# Patient Record
Sex: Male | Born: 1995 | Race: White | Hispanic: No | Marital: Single | Smoking: Never smoker
Health system: Southern US, Community
[De-identification: ages and names within clinical notes are randomized; demographics above are authoritative.]

## PROBLEM LIST (undated history)

## (undated) DIAGNOSIS — K200 Eosinophilic esophagitis: Secondary | ICD-10-CM

## (undated) HISTORY — DX: Eosinophilic esophagitis: K20.0

---
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
## 2015-12-12 DIAGNOSIS — B349 Viral infection, unspecified: Secondary | ICD-10-CM | POA: Diagnosis not present

## 2016-08-20 ENCOUNTER — Ambulatory Visit
Admission: RE | Admit: 2016-08-20 | Discharge: 2016-08-20 | Disposition: A | Discharge status: Home / Self Care | Payer: 59 | Source: Ambulatory Visit | Attending: Family Medicine | Admitting: Family Medicine

## 2016-08-20 ENCOUNTER — Ambulatory Visit (INDEPENDENT_AMBULATORY_CARE_PROVIDER_SITE_OTHER): Payer: 59 | Admitting: Family Medicine

## 2016-08-20 ENCOUNTER — Encounter: Payer: Self-pay | Admitting: Family Medicine

## 2016-08-20 DIAGNOSIS — X58XXXA Exposure to other specified factors, initial encounter: Secondary | ICD-10-CM | POA: Diagnosis not present

## 2016-08-20 DIAGNOSIS — S0993XA Unspecified injury of face, initial encounter: Secondary | ICD-10-CM | POA: Diagnosis present
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
## 2016-08-27 ENCOUNTER — Encounter: Payer: Self-pay | Admitting: Family Medicine

## 2016-08-27 ENCOUNTER — Ambulatory Visit (INDEPENDENT_AMBULATORY_CARE_PROVIDER_SITE_OTHER): Payer: 59 | Admitting: Family Medicine

## 2016-08-27 DIAGNOSIS — S76319A Strain of muscle, fascia and tendon of the posterior muscle group at thigh level, unspecified thigh, initial encounter: Secondary | ICD-10-CM

## 2016-08-27 MED ORDER — NAPROXEN 500 MG PO TABS: 500.0000 mg | ORAL_TABLET | Freq: Two times a day (BID) | ORAL | 0 refills | Status: DC

## 2016-08-27 NOTE — Progress Notes (Signed)
Patient presents today for injury to his left hamstring. Patient states that last week he had a hamstring injury during soccer match. He states that he fell to the ground after the injury. He was unable to weight-bear for a few days. He is now partial weightbearing. He states that he has improved since the injury last week. He denies any significant swelling or bruising of the area. He denies any previous hamstring injury in the past. He denies any pain at the gluteal region or with sitting. He has been using ice on the area and has been doing some light stretching with the trainer.  ROS: Negative except mentioned above. Vitals as per Epic.  GENERAL: NAD RESP: CTA B CARD: RRR MSK: Left lower extremity - there is no obvious deformity noted in the hamstring area, there is no tenderness at the ischial tuberosity, mild tenderness along the proximal medial hamstring, no ecchymosis or significant swelling appreciated around this area, there is no distal hamstring discomfort, full range of motion of left lower extremity but discomfort with full flexion and extension, mildly decreased strength when testing hamstring compared to right, NV intact  A/P: Left proximal hamstring strain - exam does not suggest avulsion from ischial tuberosity, recommend trainer continue to do rehabilitation, Naprosyn when necessary, progress as tolerated, follow-up if symptoms persist or worsen.

## 2017-06-05 ENCOUNTER — Encounter: Payer: Self-pay | Admitting: Family Medicine

## 2017-06-05 ENCOUNTER — Ambulatory Visit (INDEPENDENT_AMBULATORY_CARE_PROVIDER_SITE_OTHER): Payer: 59 | Admitting: Family Medicine

## 2017-06-05 DIAGNOSIS — M79671 Pain in right foot: Secondary | ICD-10-CM

## 2017-06-05 MED ORDER — NAPROXEN 500 MG PO TABS: 500.0000 mg | ORAL_TABLET | Freq: Two times a day (BID) | ORAL | 0 refills | Status: DC

## 2017-06-05 NOTE — Progress Notes (Signed)
Patient presents today with symptoms of right lateral foot pain. Patient states that he is experiences pain for the last 4 days. He denies any obvious trauma or injury. He denies any swelling or bruising of the area. He states that he has been doing increased activity over the last couple of weeks but nothing out of the ordinary for him. He does not have pain at rest. He denies any previous history of stress injury stress fracture. He is typically having pain on palpation and walking and eversion. He has not gotten his new cleats for soccer yet but should be here soon. He does not wear orthotics.  ROS: Negative except mentioned above. Vitals as per Epic. GENERAL: NAD MSK: R Foot - no obvious deformity, no ecchymosis or swelling appreciated, mild tenderness along the lateral and plantar aspect of the base of the fifth metatarsal, full range of motion, mild discomfort with resisted eversion, no tenderness or swelling around the ankle, normal toe and heel walk, NV intact NEURO: CN II-XII grossly intact   A/P: Right lateral foot pain - likely related to peroneal tendon, will do x-rays, will take NSAIDs for a week or so and wear be in a walking boot. Can use ice on the area. Patient is to be evaluated by trainer on a daily basis for interval change. Trainer will look at current cleats and modify with new ones as soon as possible if needed. He will modify activity for now and progress as tolerated after pain has dissipated.

## 2017-06-06 ENCOUNTER — Ambulatory Visit
Admission: RE | Admit: 2017-06-06 | Discharge: 2017-06-06 | Disposition: A | Discharge status: Home / Self Care | Payer: 59 | Source: Ambulatory Visit | Attending: Family Medicine | Admitting: Family Medicine

## 2017-06-06 DIAGNOSIS — M79671 Pain in right foot: Secondary | ICD-10-CM | POA: Diagnosis not present
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
## 2017-12-16 ENCOUNTER — Encounter: Payer: Self-pay | Admitting: Family Medicine

## 2017-12-16 ENCOUNTER — Ambulatory Visit (INDEPENDENT_AMBULATORY_CARE_PROVIDER_SITE_OTHER): Payer: 59 | Admitting: Family Medicine

## 2017-12-16 VITALS — BP 128/70 | HR 96 | Temp 97.6°F | Resp 14

## 2017-12-16 DIAGNOSIS — M2669 Other specified disorders of temporomandibular joint: Secondary | ICD-10-CM

## 2017-12-16 DIAGNOSIS — R21 Rash and other nonspecific skin eruption: Secondary | ICD-10-CM

## 2017-12-16 MED ORDER — NAPROXEN 500 MG PO TABS: 500.0000 mg | ORAL_TABLET | Freq: Two times a day (BID) | ORAL | 0 refills | Status: AC
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
## 2018-05-31 ENCOUNTER — Emergency Department: Payer: No Typology Code available for payment source

## 2018-05-31 ENCOUNTER — Emergency Department
Admission: EM | Admit: 2018-05-31 | Discharge: 2018-05-31 | Disposition: A | Discharge status: Home / Self Care | Payer: No Typology Code available for payment source | Attending: Emergency Medicine | Admitting: Emergency Medicine

## 2018-05-31 DIAGNOSIS — S99911A Unspecified injury of right ankle, initial encounter: Secondary | ICD-10-CM | POA: Insufficient documentation

## 2018-05-31 DIAGNOSIS — W03XXXA Other fall on same level due to collision with another person, initial encounter: Secondary | ICD-10-CM | POA: Insufficient documentation

## 2018-05-31 DIAGNOSIS — Y9366 Activity, soccer: Secondary | ICD-10-CM | POA: Insufficient documentation

## 2018-05-31 MED ORDER — IBUPROFEN 600 MG PO TABS
600.00 mg | ORAL_TABLET | Freq: Four times a day (QID) | ORAL | 0 refills | Status: AC | PRN
Start: 2018-05-31 — End: 2018-06-07

## 2018-05-31 MED ORDER — IBUPROFEN 600 MG PO TABS
600.00 mg | ORAL_TABLET | Freq: Once | ORAL | Status: AC
Start: 2018-05-31 — End: 2018-05-31
  Administered 2018-05-31: 21:00:00 600 mg via ORAL
  Filled 2018-05-31: qty 1

## 2018-05-31 NOTE — ED Triage Notes (Signed)
Patient was playing soccer and another player fell on patient's right ankle.  Incident occurred at 5:30pm.  No pain meds taken.

## 2018-05-31 NOTE — Discharge Instructions (Signed)
Ankle Injury    You were seen for an ankle injury.    An x-ray of your ankle shows that you do not have broken bones or anything more serious.     Some symptoms of ankle injury are:    Swelling.   Bruising.   Other skin changes.   Limited movement.    Trouble walking.     Since your ankle pain is due to an injury, the pain may take up to a few weeks to get better. This depends on how bad it is. The pain is the worst the first day or two after the injury.    Generally, treatment for ankle injury includes the use of pain medicine and a splint to reduce movement. Treatment also involves Resting, Icing, Compressing and Elevating (lifting up) the injured area. Remember this as "RICE."   REST: Limit the use of the injured body part.     ICE: By applying ice to the affected area, swelling and pain can be reduced. Place some ice cubes in a re-sealable (Ziploc) bag and add some water. Put a thin washcloth between the bag and the skin. Apply the ice bag to the area for at least 20 minutes. Do this at least 4 times per day. Using the ice for longer times and more often is okay. NEVER APPLY ICE DIRECTLY TO THE SKIN.    COMPRESS: Compression means to apply pressure around the injured area, such as with a splint, cast or an ace bandage. Compression decreases swelling and improves comfort. Compression should be tight enough to relieve swelling but not so tight as to decrease circulation. Increasing pain, numbness, tingling, or change in skin color, are all signs of decreased circulation.    ELEVATE: Elevate the injured part. For example, a leg can be elevated by placing the leg on a chair while sitting and propped up on pillows while lying down.    Use medicine like acetaminophen (Tylenol) or ibuprofen (Advil, Motrin) for pain. Follow the directions on the package. You can find this at most pharmacies.    Wear a shoe with a hard sole for a while to help with comfort. Your doctor may suggest the use of an ACE  bandage or ankle brace. You can buy a walking boot at a medical supply store as well if your doctor suggests it.    Your doctor found that no further work-up was needed today. You may follow up as an outpatient with your regular doctor. You may have been referred to an orthopedic surgeon (bone doctor), a sports medicine specialist, dermatologist (skin doctor) or a rheumatologist.    YOU SHOULD SEEK MEDICAL ATTENTION IMMEDIATELY, EITHER HERE OR AT THE NEAREST EMERGENCY DEPARTMENT, IF ANY OF THE FOLLOWING HAPPENS:     You feel a severe increase in pain in the affected area.   You get new numbness and tingling in or under the affected area.   Your ankle gets red or swollen.   Your symptoms haven't started to get better in 1-2 weeks.   Your ankle seems out of shape after the swelling gets better, especially if you didn't get x-rays during your visit.   You have a fever (temperature higher than 100.2F or 38C).    If you can't follow up with your doctor, or if at any time you feel you need to be rechecked or seen again, come back here or go to the nearest emergency department.        Crutch Use (Edu)  To Stand:   Put crutches together on the side of the weak or injured leg. Hold on to hand grips with the hand closest to the crutches.   Push up with the other hand against the arm of the chair. Stand up.   Adjust crutches under each arm. The top of each crutch should rest against the sides of the chest wall. Push down on the hand grips. Don't lean on top of the crutches. This puts pressure on nerves in the armpit. This can lead to weakness and nerve damage.    To Walk:   Put crutches 8-12 inches (20-30 cm) in front of you and around 6-8 inches (15-20 cm) to the side of the toes.   Keep the weak / injured leg off the floor as you push down on the hand grips. Step through the crutches with the other leg.   Move crutches forward. Repeat.    If you can put some weight on the injured leg (partial weight  bearing):   Put crutches 8-12 inches (20-30 cm) in front of you and around 6-8 inches (15-20 cm) to the side of the toes.   Put partial weight on the weak / injured leg. If allowed, put as much weight as you can bear.   Push down on the hand grips. Step through with the strong leg.   Move crutches forward. Repeat. Move the crutches and weak / injured leg forward together.    DO NOT place weight on the cast or splint unless you were told to.     Splint Care/Use (Edu)    You have been given a splint for your fracture. This is to lower pain and help keep the injured area from moving.    Use the splint until you follow up with the referral orthopedic (bone) doctor.    Use the following SPLINT CARE instructions. Do the following many times throughout the day:   Check capillary refill (circulation) in the nail beds. Press on the nail bed and then release. It should turn white when you press on it. It should then get pink again in less than 2 seconds after you let go.   Watch to see if the area beyond the splint gets swollen.   Sometimes the splint is too tight. When this happens, the skin of the hand/foot, fingers/toes is very cold, pale or numb to the touch. The wrap holding the splint in place can be loosened. You can come back here or go to the nearest Emergency Department to have it adjusted.

## 2018-05-31 NOTE — ED Provider Notes (Signed)
EMERGENCY DEPARTMENT HISTORY AND PHYSICAL EXAM     Physician/Midlevel provider first contact with patient: 05/31/18 2009         Date: 05/31/2018  Patient Name: Derek Reyes  Attending Physician: Kari Baars, MD  Advanced Practice Provider: Modesta Messing    History of Presenting Illness     History Provided By: Patient  Chief Complaint:  Chief Complaint   Patient presents with   . Ankle Injury     Derek Reyes is a 22 y.o. male presenting to the ED with right ankle injury during soccer when another player fell onto him at 530p. Unable to bear weight. No other injuries or complaints.    Onset,Timing, Location, Quality, Severity, Exacerbating factors, Alleviating factors  Associated symptoms and pertinent negative listed in ROS.  PCP: Pcp, None, MD  SPECIALISTSZayed, Griffie   Home Medication Instructions [id number]    Printed on:05/31/18 2125   Medication Information 08 AM 10 AM 12 Noon 06 PM 08 PM 10 PM Bed time             budesonide (PULMICORT) 0.25 MG/2ML nebulizer solution  Take 0.25 mg by nebulization            ibuprofen (ADVIL,MOTRIN) 600 MG tablet  Take 1 tablet (600 mg total) by mouth every 6 (six) hours as needed for Pain              Past History   Past Medical History:  Past Medical History:   Diagnosis Date   . Eosinophilic esophagitis      Past Surgical History:  Past Surgical History:   Procedure Laterality Date   . APPENDECTOMY       Family History:  Family History   Problem Relation Age of Onset   . Family history unknown: Yes     Social History:  Social History     Social History   . Marital status: Single     Spouse name: N/A   . Number of children: N/A   . Years of education: N/A     Social History Main Topics   . Smoking status: Never Smoker   . Smokeless tobacco: Never Used   . Alcohol use No   . Drug use: No   . Sexual activity: Not Asked     Other Topics Concern   . None     Social History Narrative   . None     Allergies:  No Known Allergies  Review of  Systems   Review of Systems   Constitutional: Negative for fever.   Musculoskeletal: Positive for joint pain.     Physical Exam     Vitals:    05/31/18 2002   BP: 126/73   Pulse: 80   Resp: 20   Temp: 98.1 F (36.7 C)   TempSrc: Oral   SpO2: 96%   Weight: 90.7 kg   Height: 6\' 2"  (1.88 m)     Physical Exam   Constitutional: He is oriented to person, place, and time. He appears well-developed and well-nourished.   Family member at bedside with patient.    HENT:   Head: Normocephalic and atraumatic.   Eyes: Conjunctivae are normal. Right eye exhibits no discharge. Left eye exhibits no discharge. No scleral icterus.   Neck: Neck supple.   Cardiovascular: Intact distal pulses.    Pulmonary/Chest: Effort normal. No stridor. No respiratory distress.   Musculoskeletal:        Right  ankle: He exhibits decreased range of motion and swelling. He exhibits normal pulse. Tenderness. Lateral malleolus and medial malleolus tenderness found. No head of 5th metatarsal and no proximal fibula tenderness found. Achilles tendon exhibits no pain.        Right foot: There is decreased range of motion, tenderness and bony tenderness.        Feet:    Neurological: He is alert and oriented to person, place, and time.   Skin: Skin is warm.   Examined where exposed   Psychiatric: He has a normal mood and affect.   Nursing note and vitals reviewed.    Diagnostic Study Results   Labs -     Results     ** No results found for the last 24 hours. **        Radiologic Studies -   Radiology Results (24 Hour)     Procedure Component Value Units Date/Time    XR Foot Right AP Lateral And Oblique [[id number]] Collected:  05/31/18 2055    Order Status:  Completed Updated:  05/31/18 2100    Narrative:       HISTORY: Acute right foot and ankle pain, tenderness, trauma    EXAM: Foot, 3 views    FINDINGS: AP, lateral and oblique view of the right foot were performed.  There is no acute fracture or dislocation noted.      Impression:        Negative for acute  fracture as above    Charlene Brooke, MD   05/31/2018 8:56 PM    XR Ankle Right 3+ Views [[id number]] Collected:  05/31/18 2054    Order Status:  Completed Updated:  05/31/18 2059    Narrative:       HISTORY: Acute right ankle pain, tenderness, trauma    EXAM: Right ankle, 4 views    FINDINGS: The mortise is intact. There is no acute fracture or  dislocation seen.    There is a 3 mm well-corticated bony density off the anterior talus and  posterior talus consistent with old trauma. Likewise, there is a 5 mm  well-corticated bony density off the dorsal and proximal navicular on  the lateral view.    There is probable 1 cm cyst in the mid calcaneus.      Impression:          1. The mortise is intact and no acute fracture seen. Probable old  fractures are identified as above.    Charlene Brooke, MD   05/31/2018 8:55 PM      .  Medical Decision Making   I am the first provider for this patient.    I reviewed the vital signs, available nursing notes, past medical history, past surgical history, family history and social history.    Vital Signs-Reviewed the patient's vital signs.     Patient Vitals for the past 12 hrs:   BP Temp Pulse Resp   05/31/18 2002 126/73 98.1 F (36.7 C) 80 20       Pulse Oximetry Analysis - NormalNormal SpO2: 96 % on RA    Procedures:   Procedures    ED Course:        Provider Notes:    22 y.o. male right ankle injury during soccer this evening. Xray neg for fx however findings related to old injury. Pt denies any prior fx. Pt is tender of the anterior talus where there is a bony density. Will place in posterior/stirrup ankle splint, crutches.  RICE. Nsaids. Ortho/Podiatry f/u in 1-5 days.  Pt advised of incidental xray findings of calcaneal cyst and other bony densities.     D/w Kari Baars, MD    Discharge Prescriptions     Medication Sig Dispense Auth. Provider    ibuprofen (ADVIL,MOTRIN) 600 MG tablet Take 1 tablet (600 mg total) by mouth every 6 (six) hours as needed for Pain 15  tablet Vanessa Kick [place], [place]            Diagnosis     Clinical Impression:   1. Right ankle injury, initial encounter        Treatment Plan:   ED Disposition     ED Disposition Condition Date/Time Comment    Discharge  Sun May 31, 2018  9:21 PM Derek Reyes discharge to home/self care.    Condition at disposition: Stable            _______________________________    CHART OWNERSHIPDorette Grate, PA-C, am the primary clinician of record.     Vanessa Kick [place], [place]  05/31/18 2125       Kari Baars, MD  06/01/18 1052

## 2018-06-05 ENCOUNTER — Ambulatory Visit (INDEPENDENT_AMBULATORY_CARE_PROVIDER_SITE_OTHER): Payer: No Typology Code available for payment source | Admitting: Orthopaedic Surgery

## 2018-06-05 ENCOUNTER — Encounter (INDEPENDENT_AMBULATORY_CARE_PROVIDER_SITE_OTHER): Payer: Self-pay

## 2018-06-05 ENCOUNTER — Encounter (INDEPENDENT_AMBULATORY_CARE_PROVIDER_SITE_OTHER): Payer: Self-pay | Admitting: Orthopaedic Surgery

## 2018-06-05 VITALS — BP 113/69 | HR 80 | Ht 74.0 in | Wt 200.0 lb

## 2018-06-05 DIAGNOSIS — S93491A Sprain of other ligament of right ankle, initial encounter: Secondary | ICD-10-CM

## 2018-06-05 DIAGNOSIS — S99921A Unspecified injury of right foot, initial encounter: Secondary | ICD-10-CM

## 2018-06-05 DIAGNOSIS — S93621A Sprain of tarsometatarsal ligament of right foot, initial encounter: Secondary | ICD-10-CM

## 2018-06-05 NOTE — Progress Notes (Signed)
At the request of Mr. Deman, I faxed the office notes and MRI referral to his athletic trainer, Ricard Dillon, at [phone]. Mr. Moradi is planning to have the MRI done at school in [place] or at home in [place]. EP

## 2018-06-05 NOTE — Progress Notes (Signed)
SUBJECTIVE:  Derek Reyes is a 22 y.o. male who sustained a right ankle/foot injury he sustained a few days  ago.    This happened when He was playing soccer.  He has foot planted and a player ran into full speed and he had an sharp injury and pain into his foot..      The patient had immediate pain. Symptoms have been constant since that time. [name] rates the pain 7.  It is described as sharp.  It is better with rest and worse with weight bearing     She did go to emergency department where x-rays negative for fracture.  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    Past Medical History:   Diagnosis Date   . Eosinophilic esophagitis      Past Surgical History:   Procedure Laterality Date   . APPENDECTOMY       Current Outpatient Prescriptions on File Prior to Visit   Medication Sig Dispense Refill   . budesonide (PULMICORT) 0.25 MG/2ML nebulizer solution Take 0.25 mg by nebulization     . ibuprofen (ADVIL,MOTRIN) 600 MG tablet Take 1 tablet (600 mg total) by mouth every 6 (six) hours as needed for Pain 15 tablet 0     No current facility-administered medications on file prior to visit.      Allergies   Allergen Reactions   . Banana    . Cherry    . Grape (Artificial) Flavor      Social History     Social History   . Marital status: Single     Spouse name: N/A   . Number of children: N/A   . Years of education: N/A     Occupational History   . Not on file.     Social History Main Topics   . Smoking status: Never Smoker   . Smokeless tobacco: Never Used   . Alcohol use No   . Drug use: No   . Sexual activity: Not on file     Other Topics Concern   . Not on file     Social History Narrative   . No narrative on file     Family History   Problem Relation Age of Onset   . Family history unknown: Yes       REVIEW OF SYSTEMS: History obtained from chart review and the patient.  General: negative for chills, fever or night sweats  Hematological and  Lymphatic: negative for bleeding problems, jaundice or pallor  Endocrine: negative for hot flashes, malaise/lethargy or mood swings  Respiratory: negative for cough, shortness of breath or wheezing  Cardiovascular: negative for chest pain, LOC or SOB  Gastrointestinal: negative for abdominal pain, appetite loss or nausea/vomiting  Musculoskeletal: positive for foot/ankle pain as listed in the history of present illness.  All other systems are negative.    Vitals:    06/05/18 0957   BP: 113/69   Pulse: 80   Weight: 90.7 kg (200 lb)   Height: 1.88 m (6\' 2" )        General: The patient is a well appearing male who appears the stated age and is in no acute distress  Psychiatry: Normal affect  Neuro: Cranial nerves 2-12 grossly intact  Eyes: No sceleral icterus  Neck: Supple, no lymphadenopathy  Cardiovascular: Normal rate and rhythem by pulse exam  Respiratory: Normal excursion of the chest wall  Abdomen:  Soft, non distended  Skin: Intact, no lesions noted    Left (Asymptomatic side) Ankle  No gross deformity  No tenderness to palpation over the metatarsal and phalanges  No pain with supination and pronation of the foot  5/5 strength with ankle dorsiflexion and plantarflexion and EHL  2+ DP pulse  Sensation intact to light touch    Right (Symptomatic side) Ankle/Foot  There is no gross deformity  There is no swelling over the lateral ankle  There is tenderness Over the midfoot.  There is pain with shucking of the 1st and 2nd metatarsal.  There is pain with supination and pronation of the foot  Anterior Drawer sign is 1+  He is unable to bear weight without sufficient pain.    5/5 strength with ankle dorsiflexion and plantarflexion and EHL  2+ DP pulse  Sensation intact to light touch    Imaging studies: Xrays of the right ankle/foot (AP, lateral and oblique views) obtained ER and personally reviewed by me show no obvious fracture.    OUTSIDE RECORDS  I also had a chance to review personally records from the patient's  emergency room department visit from our electronic medical record system.  These records show He is seen in emergent department on May 23, 2018.  X-rays negative for fracture.  He was told to follow-up with orthopedics      Impression Right foot injury, concern for Lisfranc injury    Plan:  I recommend he obtain an MRI of his right foot to rule out a Lisfranc injury.  The meantime, he will be nonweightbearing in a walking boot at this time.  I will see him back once he has the MRI done.

## 2018-06-10 ENCOUNTER — Encounter (INDEPENDENT_AMBULATORY_CARE_PROVIDER_SITE_OTHER): Payer: Self-pay

## 2018-06-10 NOTE — Progress Notes (Signed)
The mother of Derek Reyes called: she requested that we fax the MRI referral to [facility], fax [phone], in [place]. She is inquiring whether we have authorized the MRI and I explained no, the facility where the MRI is being performed should work on this authorization; however, they may call if they have questions. I have included the office notes and xr reports for the clinical info they may need. EP

## 2018-06-12 ENCOUNTER — Encounter (INDEPENDENT_AMBULATORY_CARE_PROVIDER_SITE_OTHER): Payer: Self-pay

## 2018-06-12 NOTE — Progress Notes (Signed)
Ms. Pinsky called again to request that we send the MRI referral: she would like for this to go to [facility] in [place], faxed to [phone]. I again explained that the facility performing the MRI will need to work on the auth with [facility], and that the timeframe to have the MRI on Tues is very tight. I sent the recent office note, xr reports, and MRI referral to [facility]. EP

## 2018-06-15 ENCOUNTER — Other Ambulatory Visit: Payer: Self-pay | Admitting: Family Medicine

## 2018-06-15 DIAGNOSIS — S93621A Sprain of tarsometatarsal ligament of right foot, initial encounter: Secondary | ICD-10-CM

## 2018-06-15 DIAGNOSIS — S99921A Unspecified injury of right foot, initial encounter: Secondary | ICD-10-CM

## 2018-06-16 ENCOUNTER — Encounter (INDEPENDENT_AMBULATORY_CARE_PROVIDER_SITE_OTHER): Payer: Self-pay

## 2018-06-16 ENCOUNTER — Ambulatory Visit
Admission: RE | Admit: 2018-06-16 | Discharge: 2018-06-16 | Disposition: A | Discharge status: Home / Self Care | Payer: 59 | Source: Ambulatory Visit | Attending: Family Medicine | Admitting: Family Medicine

## 2018-06-16 DIAGNOSIS — X58XXXA Exposure to other specified factors, initial encounter: Secondary | ICD-10-CM | POA: Insufficient documentation

## 2018-06-16 DIAGNOSIS — S99921A Unspecified injury of right foot, initial encounter: Secondary | ICD-10-CM | POA: Insufficient documentation

## 2018-06-16 DIAGNOSIS — S93621A Sprain of tarsometatarsal ligament of right foot, initial encounter: Secondary | ICD-10-CM

## 2018-06-16 NOTE — Progress Notes (Signed)
I received multiple calls from [facility], [facility], Sharron, and his Mother that they have the MRI coming up today: I called [employer] and spoke with Lurena Joiner to review the clinical info: PA  For [facility] [place]  [phone], 06/16/18- 07/31/18. I called back to [facility] and gave the info to the scheduling staff and asked that they relay the info to the staff there. EP

## 2018-07-01 ENCOUNTER — Encounter (INDEPENDENT_AMBULATORY_CARE_PROVIDER_SITE_OTHER): Payer: Self-pay

## 2018-07-01 ENCOUNTER — Other Ambulatory Visit (INDEPENDENT_AMBULATORY_CARE_PROVIDER_SITE_OTHER): Payer: Self-pay | Admitting: Orthopaedic Surgery

## 2018-07-01 DIAGNOSIS — S93621A Sprain of tarsometatarsal ligament of right foot, initial encounter: Secondary | ICD-10-CM

## 2019-04-28 IMAGING — CR DG FOOT COMPLETE 3+V*R*
1 series · 4 of 4 positions shown · non-contrast
Comparison: June 06, 2017

CLINICAL DATA: Soccer injury 2 weeks prior

EXAM:
RIGHT FOOT COMPLETE - 3+ VIEW

[Series 1: dg foot complete right · 0.14mm/px · 4 of 4 slices shown]
[im 1/4]
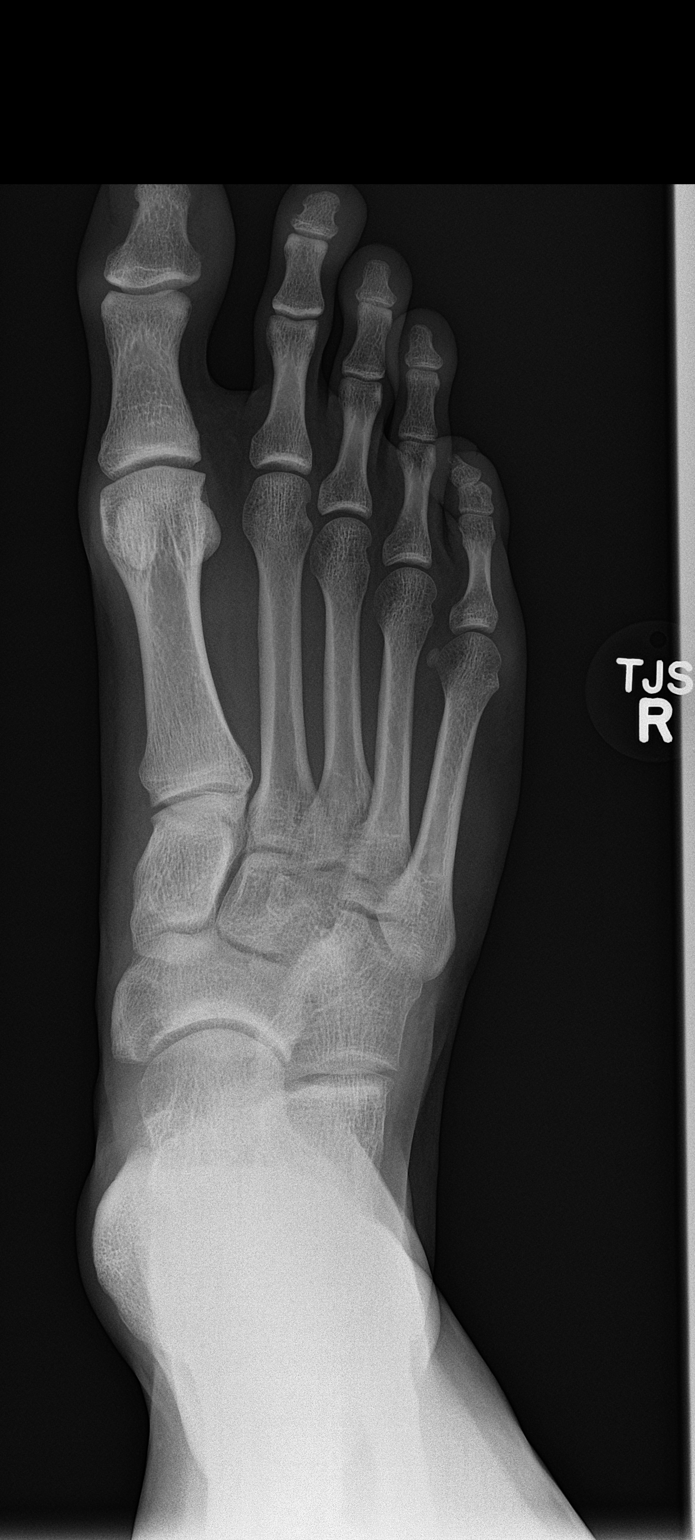
[im 2/4]
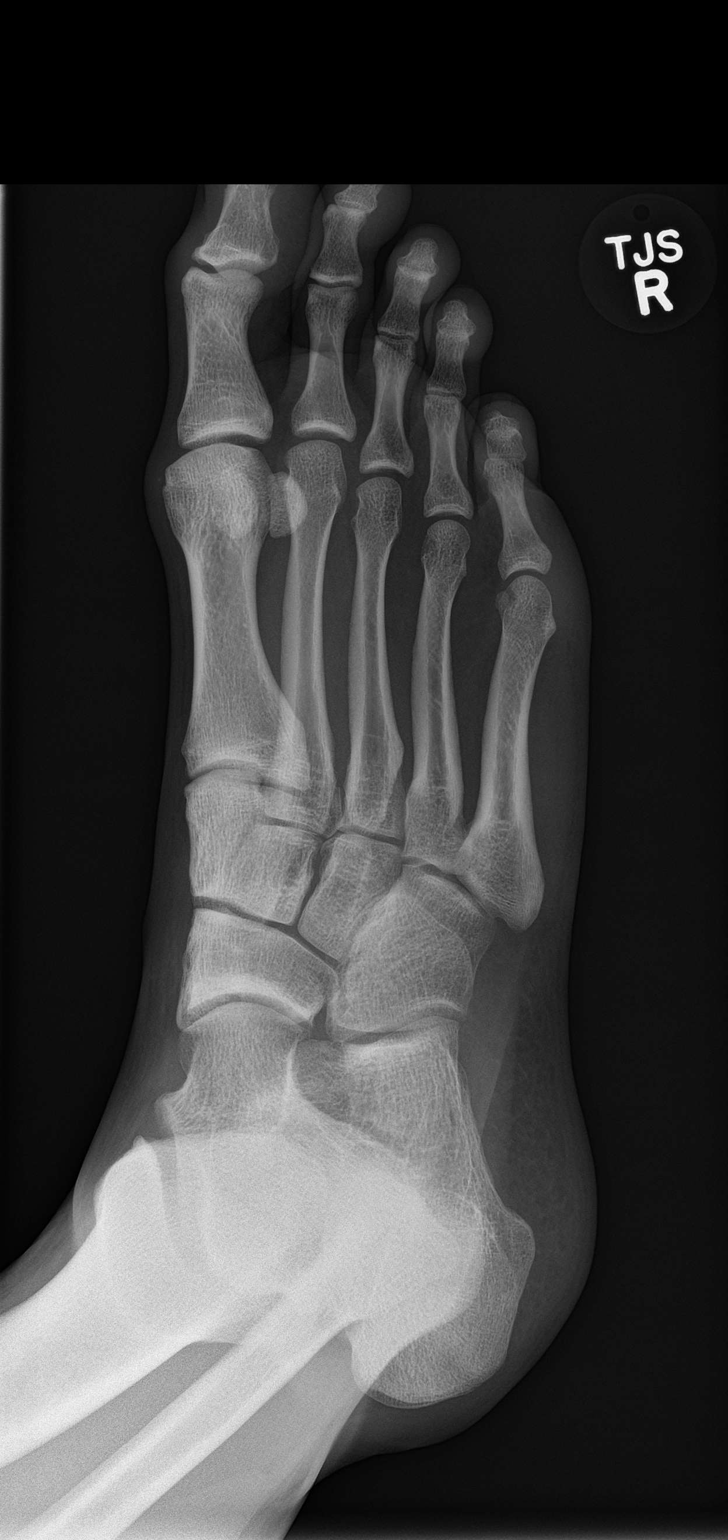
[im 3/4]
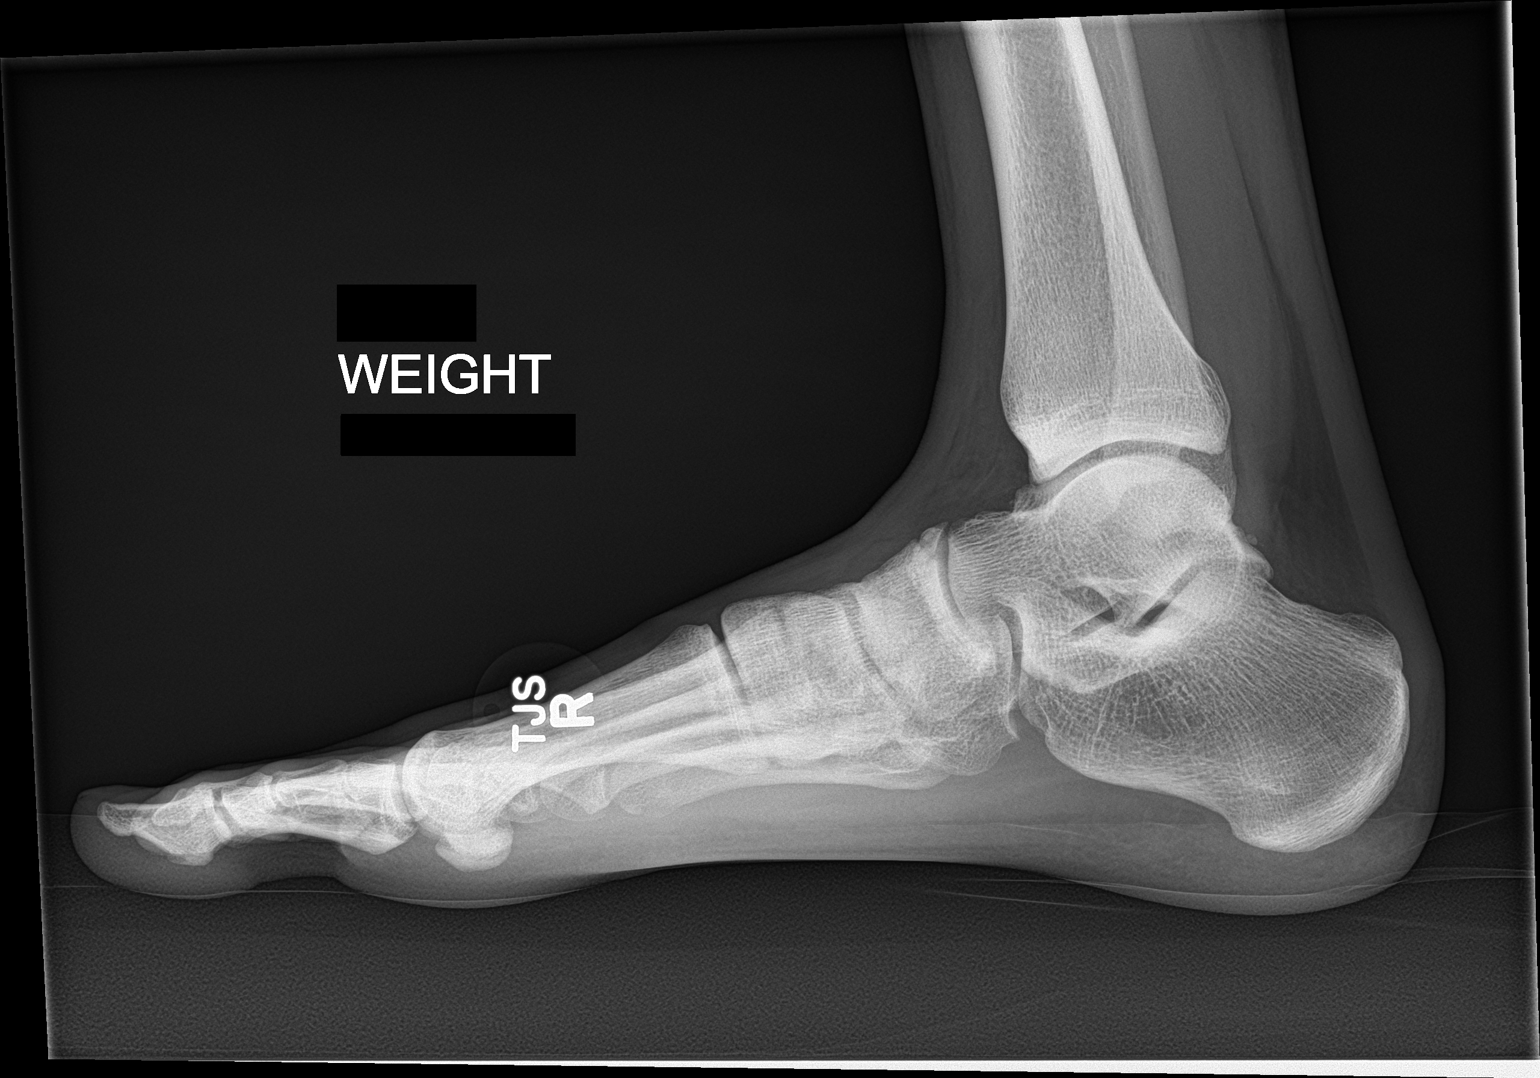
[im 4/4]
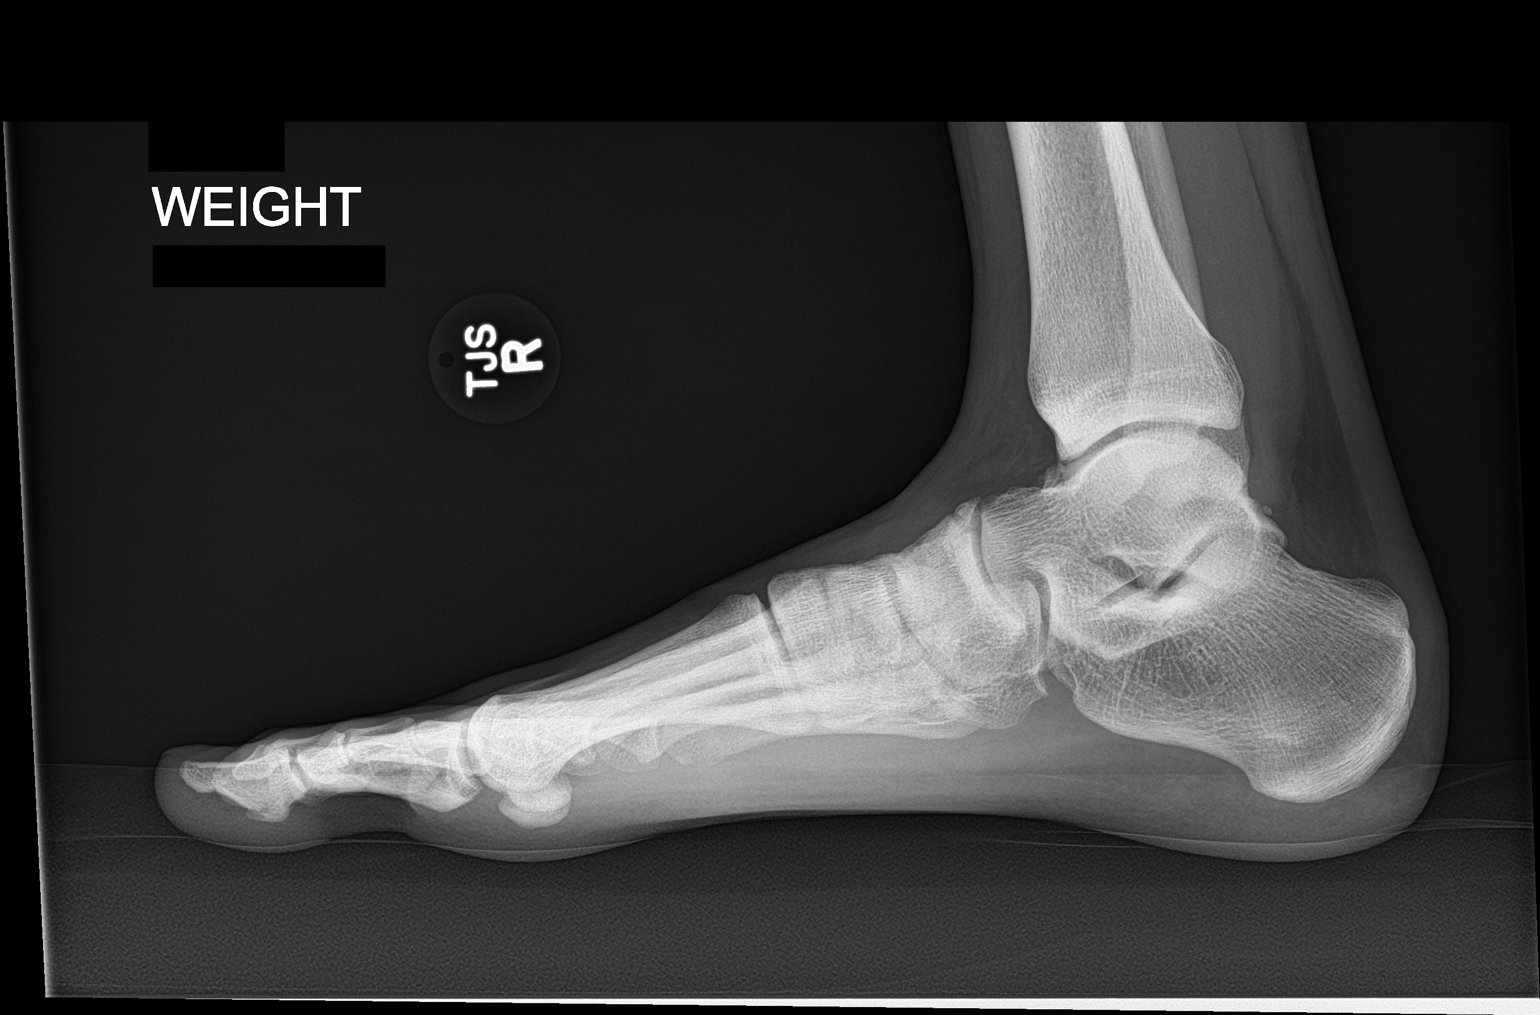

[4 of 4 positions shown; findings below may reference images not displayed]

FINDINGS: Weightbearing frontal, weight-bearing oblique, and weight-bearing
lateral images obtained. No fracture or dislocation is demonstrable.
Joint spaces appear normal. No erosive change. There is a small spur
arising from the dorsal proximal navicular, stable.
IMPRESSION: No evident fracture or dislocation. No appreciable arthropathic
change.

## 2019-10-05 ENCOUNTER — Encounter (INDEPENDENT_AMBULATORY_CARE_PROVIDER_SITE_OTHER): Payer: Self-pay

## 2019-10-12 ENCOUNTER — Ambulatory Visit (INDEPENDENT_AMBULATORY_CARE_PROVIDER_SITE_OTHER): Payer: Commercial Managed Care - POS

## 2019-10-12 DIAGNOSIS — Z01818 Encounter for other preprocedural examination: Secondary | ICD-10-CM

## 2019-10-12 DIAGNOSIS — Z1159 Encounter for screening for other viral diseases: Secondary | ICD-10-CM

## 2019-10-13 LAB — COVID-19 (SARS-COV-2): SARS CoV 2 Overall Result: NOT DETECTED

## 2021-09-20 ENCOUNTER — Ambulatory Visit (INDEPENDENT_AMBULATORY_CARE_PROVIDER_SITE_OTHER): Payer: Self-pay | Admitting: Physician Assistant
